# Patient Record
Sex: Male | Born: 1970 | Race: White | Hispanic: Yes | Marital: Single | Smoking: Current every day smoker
Health system: Southern US, Community
[De-identification: ages and names within clinical notes are randomized; demographics above are authoritative.]

## PROBLEM LIST (undated history)

## (undated) HISTORY — PX: OTHER SURGICAL HISTORY: SHX169

---

## 1998-05-03 ENCOUNTER — Emergency Department (HOSPITAL_COMMUNITY): Admission: EM | Admit: 1998-05-03 | Discharge: 1998-05-03 | Payer: Self-pay | Admitting: Emergency Medicine

## 1998-05-05 ENCOUNTER — Emergency Department (HOSPITAL_COMMUNITY): Admission: EM | Admit: 1998-05-05 | Discharge: 1998-05-05 | Payer: Self-pay | Admitting: Emergency Medicine

## 1999-02-19 ENCOUNTER — Emergency Department (HOSPITAL_COMMUNITY): Admission: EM | Admit: 1999-02-19 | Discharge: 1999-02-19 | Payer: Self-pay | Admitting: Emergency Medicine

## 1999-02-20 ENCOUNTER — Encounter: Payer: Self-pay | Admitting: Emergency Medicine

## 2000-01-27 ENCOUNTER — Encounter: Payer: Self-pay | Admitting: Emergency Medicine

## 2000-01-27 ENCOUNTER — Emergency Department (HOSPITAL_COMMUNITY): Admission: EM | Admit: 2000-01-27 | Discharge: 2000-01-27 | Payer: Self-pay | Admitting: Emergency Medicine

## 2002-10-19 ENCOUNTER — Emergency Department (HOSPITAL_COMMUNITY): Admission: AC | Admit: 2002-10-19 | Discharge: 2002-10-19 | Payer: Self-pay

## 2002-10-19 ENCOUNTER — Encounter: Payer: Self-pay | Admitting: Emergency Medicine

## 2003-09-12 ENCOUNTER — Emergency Department (HOSPITAL_COMMUNITY): Admission: AC | Admit: 2003-09-12 | Discharge: 2003-09-12 | Payer: Self-pay

## 2003-11-16 ENCOUNTER — Emergency Department (HOSPITAL_COMMUNITY): Admission: AC | Admit: 2003-11-16 | Discharge: 2003-11-16 | Payer: Self-pay

## 2005-07-20 ENCOUNTER — Inpatient Hospital Stay (HOSPITAL_COMMUNITY): Admission: EM | Admit: 2005-07-20 | Discharge: 2005-07-23 | Payer: Self-pay | Admitting: Emergency Medicine

## 2005-08-01 ENCOUNTER — Emergency Department (HOSPITAL_COMMUNITY): Admission: EM | Admit: 2005-08-01 | Discharge: 2005-08-01 | Payer: Self-pay | Admitting: Emergency Medicine

## 2005-08-04 ENCOUNTER — Encounter: Admission: RE | Admit: 2005-08-04 | Discharge: 2005-10-15 | Payer: Self-pay | Admitting: Orthopaedic Surgery

## 2005-11-09 ENCOUNTER — Ambulatory Visit (HOSPITAL_COMMUNITY): Admission: RE | Admit: 2005-11-09 | Discharge: 2005-11-10 | Payer: Self-pay | Admitting: Orthopaedic Surgery

## 2005-12-16 ENCOUNTER — Encounter: Payer: Self-pay | Admitting: Emergency Medicine

## 2007-02-04 ENCOUNTER — Emergency Department (HOSPITAL_COMMUNITY): Admission: EM | Admit: 2007-02-04 | Discharge: 2007-02-04 | Payer: Self-pay | Admitting: Emergency Medicine

## 2007-09-07 ENCOUNTER — Emergency Department (HOSPITAL_COMMUNITY): Admission: EM | Admit: 2007-09-07 | Discharge: 2007-09-07 | Payer: Self-pay | Admitting: Emergency Medicine

## 2007-09-16 ENCOUNTER — Emergency Department (HOSPITAL_COMMUNITY): Admission: EM | Admit: 2007-09-16 | Discharge: 2007-09-16 | Payer: Self-pay | Admitting: Emergency Medicine

## 2007-09-20 ENCOUNTER — Ambulatory Visit: Payer: Self-pay | Admitting: *Deleted

## 2007-10-11 ENCOUNTER — Emergency Department (HOSPITAL_COMMUNITY): Admission: EM | Admit: 2007-10-11 | Discharge: 2007-10-11 | Payer: Self-pay | Admitting: Emergency Medicine

## 2007-10-27 ENCOUNTER — Emergency Department (HOSPITAL_COMMUNITY): Admission: EM | Admit: 2007-10-27 | Discharge: 2007-10-28 | Payer: Self-pay | Admitting: Emergency Medicine

## 2007-11-01 ENCOUNTER — Emergency Department (HOSPITAL_COMMUNITY): Admission: EM | Admit: 2007-11-01 | Discharge: 2007-11-01 | Payer: Self-pay | Admitting: Emergency Medicine

## 2007-11-14 ENCOUNTER — Ambulatory Visit: Payer: Self-pay | Admitting: Internal Medicine

## 2007-11-14 LAB — CONVERTED CEMR LAB
Albumin: 4.1 g/dL (ref 3.5–5.2)
Alkaline Phosphatase: 69 units/L (ref 39–117)
Amphetamine Screen, Ur: NEGATIVE
BUN: 13 mg/dL (ref 6–23)
Barbiturate Quant, Ur: NEGATIVE
Basophils Relative: 1 % (ref 0–1)
Benzodiazepines.: POSITIVE — AB
CO2: 23 meq/L (ref 19–32)
Creatinine,U: 120.1 mg/dL
Eosinophils Absolute: 0.2 10*3/uL (ref 0.0–0.7)
Glucose, Bld: 111 mg/dL — ABNORMAL HIGH (ref 70–99)
Hemoglobin: 15.5 g/dL (ref 13.0–17.0)
Lymphs Abs: 2 10*3/uL (ref 0.7–4.0)
MCHC: 32.5 g/dL (ref 30.0–36.0)
MCV: 84.1 fL (ref 78.0–100.0)
Methadone: NEGATIVE
Monocytes Absolute: 0.8 10*3/uL (ref 0.1–1.0)
Monocytes Relative: 8 % (ref 3–12)
Potassium: 4.3 meq/L (ref 3.5–5.3)
Propoxyphene: NEGATIVE
RBC: 5.67 M/uL (ref 4.22–5.81)
Sodium: 141 meq/L (ref 135–145)
TSH: 1.18 microintl units/mL (ref 0.350–5.50)
Total Bilirubin: 0.3 mg/dL (ref 0.3–1.2)
Total Protein: 6.5 g/dL (ref 6.0–8.3)
WBC: 10.5 10*3/uL (ref 4.0–10.5)

## 2007-12-05 ENCOUNTER — Emergency Department (HOSPITAL_COMMUNITY): Admission: EM | Admit: 2007-12-05 | Discharge: 2007-12-05 | Payer: Self-pay | Admitting: Emergency Medicine

## 2008-01-08 ENCOUNTER — Emergency Department (HOSPITAL_COMMUNITY): Admission: EM | Admit: 2008-01-08 | Discharge: 2008-01-08 | Payer: Self-pay | Admitting: Emergency Medicine

## 2008-01-18 ENCOUNTER — Emergency Department (HOSPITAL_COMMUNITY): Admission: EM | Admit: 2008-01-18 | Discharge: 2008-01-18 | Payer: Self-pay | Admitting: Emergency Medicine

## 2009-01-30 ENCOUNTER — Emergency Department (HOSPITAL_COMMUNITY): Admission: EM | Admit: 2009-01-30 | Discharge: 2009-01-30 | Payer: Self-pay | Admitting: Emergency Medicine

## 2009-02-01 ENCOUNTER — Ambulatory Visit: Payer: Self-pay | Admitting: Internal Medicine

## 2009-02-04 ENCOUNTER — Emergency Department (HOSPITAL_COMMUNITY): Admission: EM | Admit: 2009-02-04 | Discharge: 2009-02-04 | Payer: Self-pay | Admitting: Emergency Medicine

## 2009-02-06 ENCOUNTER — Emergency Department (HOSPITAL_COMMUNITY): Admission: EM | Admit: 2009-02-06 | Discharge: 2009-02-06 | Payer: Self-pay | Admitting: Emergency Medicine

## 2009-10-11 ENCOUNTER — Emergency Department (HOSPITAL_COMMUNITY): Admission: EM | Admit: 2009-10-11 | Discharge: 2009-10-11 | Payer: Self-pay | Admitting: Emergency Medicine

## 2009-10-12 ENCOUNTER — Emergency Department (HOSPITAL_COMMUNITY): Admission: EM | Admit: 2009-10-12 | Discharge: 2009-10-12 | Payer: Self-pay | Admitting: Emergency Medicine

## 2011-01-02 NOTE — Cardiovascular Report (Signed)
[name], [name]             ACCOUNT NO.:  [id number]   MEDICAL RECORD NO.:  [id number]          PATIENT TYPE:  INP   LOCATION:  5015                         FACILITY:  [facility]   PHYSICIAN:  Mark C. Ophelia Charter, M.D.    DATE OF BIRTH:  [birth year]   DATE OF PROCEDURE:  07/22/2005  DATE OF DISCHARGE:                              CARDIAC CATHETERIZATION   PREOPERATIVE DIAGNOSIS:  Right fourth and fifth displaced metacarpal  fracture.   POSTOPERATIVE DIAGNOSIS:  Right fourth and fifth displaced metacarpal  fracture.   PROCEDURE:  Open reduction internal fixation of right fourth and fifth  metacarpal fracture.   SURGEON:  Mark C. Ophelia Charter, M.D.   ANESTHESIA:  GOT.   TOURNIQUET TIME:  51 minutes.   DESCRIPTION OF PROCEDURE:  After induction of general anesthesia and  orotracheal intubation, the proximal arm tourniquet, preoperative Ancef  prophylaxis, Betadine scrub, Betadine solution, using extremity sheets and  drapes, stockinette, rolled towel, a towel clip and extremity sheets and  drapes were applied.  The skin marker was used on the dorsum of the hand and  a large glove was used to cover the fingers which had lacerations.  The  fingers had some swelling but closure looked good and both the fracture  incisions looked good.  A Coban was used over the fingers to keep the glove  from sliding off and sterile skin marker was used from the dorsum of the  hand mid way between the fourth and fifth metacarpal shafts.  The incision  was made.  The transverse veins were coagulated with the bipolar cautery.  The juncture was slightly divided for exposure.  The fifth metacarpal was  exposed first.  There was a displaced fracture of the mid portion of the  shaft which was a long spiral and it was fixed with two 2.0 mm  interfragmentary screws 8 and 10 mm in length.  A third screw was placed  more proximally to lag the dorsal and volar portions.  This gave excellent  stability.  Stress was  applied.  There was no motion.  There was one portion  of the finger that had a slight buckle,when pressed on it, would partially  reduce but out to length and rotation was anatomic.  Three screws were  placed.  Different spot films were taken to check position and all looked  good.  Next, the fourth metacarpal shaft was exposed.  There were three  fragments and initial lagging two pieces once they were reduced anatomic and  then a third screw was lagged more distally with anatomic fixation and good  rotation.  After copious irrigation, some 4-0 Vicryl reapproximated  subcutaneous tissue, 4-0 nylon skin suture, closure Xeroform and then  checking rotation and rotation of the fingertips looked good.  After repeat  irrigation, the patient was placed in a hand dressing, Xeroform and a  Xeroform applied to the fingers.  A 4 x 4's after Betadine solution applied  to the forearms and copious Webril and Ace wraps.  Instrument count and  needle counts were correct.      Mark C. Ophelia Charter, M.D.  Electronically Signed    MCY/MEDQ  D:  07/22/2005  T:  07/23/2005  Job:  [id number]
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
## 2011-01-02 NOTE — Op Note (Signed)
[name], DOLPH             ACCOUNT NO.:  [id number]   MEDICAL RECORD NO.:  [id number]          PATIENT TYPE:  OIB   LOCATION:  5041                         FACILITY:  [facility]   PHYSICIAN:  Mark C. Ophelia Charter, M.D.    DATE OF BIRTH:  [birth year]   DATE OF PROCEDURE:  11/09/2005  DATE OF DISCHARGE:                                 OPERATIVE REPORT   PREOPERATIVE DIAGNOSES:  1.  Status post right both bone forearm fracture,  2.  Metacarpal fractures; and,  3.  Finger lacerations with both bone forearm fracture, malunion with bent      plates.   POSTOPERATIVE DIAGNOSES:  1.  Status post right both bone forearm fracture,  2.  Metacarpal fractures; and,  3.  Finger lacerations with both bone forearm fracture, malunion with bent      plates.   OPERATION PERFORMED:  1.  Takedown of malunion, right both bone forearm fracture,  2.  Replating; and,  3.  Graft-on grafting of radius and ulna.   SURGEON:  Mark C. Ophelia Charter, M.D.   ASSISTANT:  Vanita Panda. Magnus Ivan, M.D.   TOURNIQUET TIME:  The tourniquet time was two hours.   DESCRIPTION OF THE OPERATION:  After the induction of general anesthesia,  orotracheal intubation, preoperative Ancef prophylaxis, placement of  proximal arm tourniquet, and prepping of the entire right upper extremity  with DuraPrep up to the tourniquet were carried out.  Stockinette, extremity  sheaths, drapes, towels, and towel clips were applied.  The stockinette was  cut, a glove was placed over the hand and a Betadine Vi-drape was applied  sealing the skin.   After a skin marker was used the patient had a volar incision for the radius  exposure, which had widened with a keloid and this scar was excised.  Careful blunt dissection with loop magnification was performed going through  the old scar.  The radial artery was dissected free and some  branches more  proximally into the volar muscles had to be divided in order to allow  exposure since the patient had  extensive scar tissue.  The superficial  radial nerve was identified underneath the brachioradialis and protected.   The plate was identified and followed from distal to proximal.  The patient  had a muscular forearm in the proximal aspect, which was about 4 cm below  the radial tuberosity.  The proximal portion of the plate was finally  identified. This was a recon plate fixed with locking Synthes screw and the  plate had been at the level of the fracture site.  Screws were removed and  then plate was removed.  The ulna was then exposed and similarly the  hardware was removed.   The malunion of the ulna still had some motion.  The fracture site was  cleaned out using a hand rongeur and a tiny curet.  The ulna was placed back  in a straight position and an eight-hole locking 3.5 DCP plate was applied  leaving the hole __________  fracture empty and filling all the rest with  locking screws getting eight cortices on one side  of the fracture and six on  the other.  There was a small butterfly piece on the opposite cortex from  the plate and fibrous tissue was curetted.   Next, attention was turned back to the radius.  The radius had some  hypertrophic callous excised. The fracture line was cleaned out again with  small curet and then a six-hole 3.5 DCP locking plate was applied.  All  holes were locked down with the locking screw, except for the one directly  at the fracture site, which was a regular 3.5 bicortical angled stylet for  purchase.  Some graft-on was mixed with putty plus bone chips to graft the  radius.  A small amount was placed in the ulna.  There was not really much  room at the ulna for any bone graft.  The radial artery was preserved.   The tourniquet was deflated after two hours, which was near the end of the  case where the plate was being adjusted for the radius.  Fluoroscopy was  used to check the distal radioulnar joint of the radial with __________  flexion and  extended to make sure that the radial head was well reduced.  The patient had good pronation, but lacked about 30 degrees of full  supination.  X-ray showed satisfactory position of the radius and ulna.   After irrigation with saline solution the subcutaneous tissue was  reapproximated with 2-0 Vicryl and then skin closure was done.  Postoperative dressing and sugar tong splint were placed.   COUNTS:  Instrument count and needle count were correct.      Mark C. Ophelia Charter, M.D.  Electronically Signed     MCY/MEDQ  D:  11/09/2005  T:  11/11/2005  Job:  [id number]
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
## 2013-09-13 ENCOUNTER — Emergency Department (HOSPITAL_COMMUNITY)
Admission: EM | Admit: 2013-09-13 | Discharge: 2013-09-13 | Disposition: A | Discharge status: Home / Self Care | Payer: Self-pay | Attending: Emergency Medicine | Admitting: Emergency Medicine

## 2013-09-13 ENCOUNTER — Encounter (HOSPITAL_COMMUNITY): Payer: Self-pay | Admitting: Emergency Medicine

## 2013-09-13 DIAGNOSIS — B353 Tinea pedis: Secondary | ICD-10-CM | POA: Insufficient documentation

## 2013-09-13 DIAGNOSIS — F17200 Nicotine dependence, unspecified, uncomplicated: Secondary | ICD-10-CM | POA: Insufficient documentation

## 2013-09-13 MED ORDER — CLOTRIMAZOLE 1 % EX SOLN: Freq: Two times a day (BID) | CUTANEOUS | Status: DC

## 2013-09-13 MED ORDER — CLOTRIMAZOLE 1 % EX CREA
TOPICAL_CREAM | Freq: Once | CUTANEOUS | Status: DC
  Filled 2013-09-13: qty 15

## 2013-09-13 NOTE — ED Provider Notes (Signed)
Medical screening examination/treatment/procedure(s) were performed by non-physician practitioner and as supervising physician I was immediately available for consultation/collaboration.    Dione Boozeavid Jamille Yoshino, MD 09/13/13 [phone]

## 2013-09-13 NOTE — ED Provider Notes (Signed)
CSN: [id number]     Arrival date & time 09/13/13  0256 History   First MD Initiated Contact with Patient 09/13/13 0319     Chief Complaint  Patient presents with  . Painful feet    (Consider location/radiation/quality/duration/timing/severity/associated sxs/prior Treatment) HPI Comments: Patient states he used to work in a wet environment where his feet were always wet.  He contracted Tinea pedis for which he has been treated with a topical and PO antifungal  It initially got better but has flared up in the past weeks.  He no longer works at that job.   The history is provided by the patient.    History reviewed. No pertinent past medical history. Past Surgical History  Procedure Laterality Date  . Arm surgery     History reviewed. No pertinent family history. History  Substance Use Topics  . Smoking status: Current Every Day Smoker  . Smokeless tobacco: Never Used  . Alcohol Use: No    Review of Systems  Constitutional: Negative for fever.  Musculoskeletal: Negative for joint swelling.  Skin: Positive for rash.  Neurological: Negative for numbness.  All other systems reviewed and are negative.    Allergies  Review of patient's allergies indicates no known allergies.  Home Medications   Current Outpatient Rx  Name  Route  Sig  Dispense  Refill  . clotrimazole (LOTRIMIN) 1 % external solution   Topical   Apply topically 2 (two) times daily.   30 mL   0    BP 144/98  Pulse 92  Temp(Src) 97.6 F (36.4 C) (Oral)  Resp 20  Ht 5\' 8"  (1.727 m)  Wt 180 lb (81.647 kg)  BMI 27.38 kg/m2  SpO2 99% Physical Exam  Nursing note and vitals reviewed. Constitutional: He appears well-developed and well-nourished.  HENT:  Head: Normocephalic.  Eyes: Pupils are equal, round, and reactive to light.  Neck: Normal range of motion.  Cardiovascular: Normal rate and regular rhythm.   Pulmonary/Chest: Effort normal and breath sounds normal.  Musculoskeletal: He exhibits  tenderness. He exhibits no edema.  Tinea between toes on Rgiht foot between toes 3,4 On Left foot between 2,3  3,4 without swelling   Neurological: He is alert.  Skin: Skin is warm. No erythema.    ED Course  Procedures (including critical care time) Labs Review Labs Reviewed - No data to display Imaging Review No results found.  EKG Interpretation   None       MDM   1. Tinea pedis of both feet     Patient has been give Lotrim ointment int he ED and Rx for same and referral to Podiatry     Arman FilterGail K Bennet Kujawa, NP 09/13/13 [phone]  Arman FilterGail K Josslynn Mentzer, NP 09/13/13 [phone]

## 2013-09-13 NOTE — ED Notes (Signed)
Patient presents stating he has a "fungus" on his feet.  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

## 2013-09-13 NOTE — Discharge Instructions (Signed)
Make sure your feet are clean and dry prior to applying the medication In the house wear white socks and your sandals  When out please make sure your shoes are dry

## 2013-09-13 NOTE — ED Notes (Signed)
Discussed medication ordered and was going to apply, but patient prefers to go upstairs to be with sister, and will apply it then himself.  He states he already "uses this at home".

## 2014-02-26 ENCOUNTER — Encounter (HOSPITAL_COMMUNITY): Payer: Self-pay | Admitting: Emergency Medicine

## 2014-02-26 ENCOUNTER — Emergency Department (HOSPITAL_COMMUNITY)
Admission: EM | Admit: 2014-02-26 | Discharge: 2014-02-26 | Disposition: A | Discharge status: Home / Self Care | Payer: Self-pay | Attending: Emergency Medicine | Admitting: Emergency Medicine

## 2014-02-26 DIAGNOSIS — F17200 Nicotine dependence, unspecified, uncomplicated: Secondary | ICD-10-CM | POA: Insufficient documentation

## 2014-02-26 DIAGNOSIS — H00019 Hordeolum externum unspecified eye, unspecified eyelid: Secondary | ICD-10-CM | POA: Insufficient documentation

## 2014-02-26 DIAGNOSIS — H00026 Hordeolum internum left eye, unspecified eyelid: Secondary | ICD-10-CM

## 2014-02-26 MED ORDER — ERYTHROMYCIN 5 MG/GM OP OINT: TOPICAL_OINTMENT | OPHTHALMIC | Status: DC

## 2014-02-26 NOTE — ED Notes (Signed)
Pt. Paged, no response.

## 2014-02-26 NOTE — ED Notes (Signed)
Bilateral eye redness and drainage x 2 days. 

## 2014-02-26 NOTE — ED Provider Notes (Signed)
CSN: [id number]     Arrival date & time 02/26/14  1312 History  This chart was scribed for non-physician practitioner working with Rolland PorterMark James, MD by Leone PayorSonum Patel, ED Scribe. This patient was seen in room TR04C/TR04C and the patient's care was started at 4:00 PM.     Chief Complaint  Patient presents with  . Eye Pain   The history is provided by the patient. No language interpreter was used.    HPI Comments: [name] is a 43 y.o. male who presents to the Emergency Department complaining of constant right eye redness and pain that began 1 week ago. Patient reports constant, gradually worsened left eye pain, swelling, and redness that began yesterday. He reports associated photophobia and states it is alleviated by resting in the dark. He denies any other symptoms.   History reviewed. No pertinent past medical history. Past Surgical History  Procedure Laterality Date  . Arm surgery     No family history on file. History  Substance Use Topics  . Smoking status: Current Every Day Smoker  . Smokeless tobacco: Never Used  . Alcohol Use: No    Review of Systems  Constitutional: Negative for fever, chills and fatigue.  HENT: Negative for trouble swallowing.   Eyes: Positive for photophobia, pain and redness. Negative for visual disturbance.  Respiratory: Negative for shortness of breath.   Cardiovascular: Negative for chest pain and palpitations.  Gastrointestinal: Negative for nausea, vomiting, abdominal pain and diarrhea.  Genitourinary: Negative for dysuria and difficulty urinating.  Musculoskeletal: Negative for arthralgias and neck pain.  Skin: Negative for color change.  Neurological: Negative for dizziness and weakness.  Psychiatric/Behavioral: Negative for dysphoric mood.      Allergies  Review of patient's allergies indicates no known allergies.  Home Medications   Prior to Admission medications   Not on File   BP 137/91  Pulse 56  Temp(Src) 98.7 F (37.1  C)  Resp 16  SpO2 100% Physical Exam  Nursing note and vitals reviewed. Constitutional: He is oriented to person, place, and time. He appears well-developed and well-nourished.  HENT:  Head: Normocephalic and atraumatic.  Eyes: Conjunctivae and EOM are normal. Pupils are equal, round, and reactive to light. Left eye exhibits hordeolum. Right conjunctiva is not injected. Left conjunctiva is not injected.  Tenderness to palpation of left lower eyelid with localized swelling. No drainage noted. No conjunctival injection.   Neck: Normal range of motion.  Cardiovascular: Normal rate.   Pulmonary/Chest: Effort normal.  Abdominal: He exhibits no distension.  Musculoskeletal: Normal range of motion.  Neurological: He is alert and oriented to person, place, and time.  Skin: Skin is warm and dry.  Psychiatric: He has a normal mood and affect. His behavior is normal.    ED Course  Procedures (including critical care time)  DIAGNOSTIC STUDIES: Oxygen Saturation is 100% on RA, normal by my interpretation.    COORDINATION OF CARE: 4:04 PM Discussed treatment plan with pt at bedside and pt agreed to plan.   Labs Review Labs Reviewed - No data to display  Imaging Review No results found.   EKG Interpretation None      MDM   Final diagnoses:  Hordeolum eyelid, internal, left    4:09 PM Patient has a hordeolum of the left lower eyelid. Patient will have erythromycin ointment for symptoms. No vision impairment or injury. Vitals stable and patient afebrile.   I personally performed the services described in this documentation, which was scribed in my  presence. The recorded information has been reviewed and is accurate.   Emilia Beck, PA-C 02/26/14 1611

## 2014-02-26 NOTE — Discharge Instructions (Signed)
Use erythromycin ointment as directed until symptoms resolve. Refer to attached documents for more information. Return to the ED with worsening or concerning symptoms.

## 2014-03-06 NOTE — ED Provider Notes (Signed)
Medical screening examination/treatment/procedure(s) were performed by non-physician practitioner and as supervising physician I was immediately available for consultation/collaboration.   EKG Interpretation None        Rolland PorterMark Kalyna Paolella, MD 03/06/14 [phone]

## 2015-05-06 ENCOUNTER — Emergency Department (HOSPITAL_COMMUNITY)
Admission: EM | Admit: 2015-05-06 | Discharge: 2015-05-06 | Disposition: A | Discharge status: Home / Self Care | Payer: Self-pay | Attending: Emergency Medicine | Admitting: Emergency Medicine

## 2015-05-06 ENCOUNTER — Encounter (HOSPITAL_COMMUNITY): Payer: Self-pay

## 2015-05-06 DIAGNOSIS — R112 Nausea with vomiting, unspecified: Secondary | ICD-10-CM | POA: Insufficient documentation

## 2015-05-06 DIAGNOSIS — Z720 Tobacco use: Secondary | ICD-10-CM | POA: Insufficient documentation

## 2015-05-06 DIAGNOSIS — R197 Diarrhea, unspecified: Secondary | ICD-10-CM | POA: Insufficient documentation

## 2015-05-06 LAB — COMPREHENSIVE METABOLIC PANEL
ALT: 24 U/L (ref 17–63)
AST: 20 U/L (ref 15–41)
Albumin: 3.5 g/dL (ref 3.5–5.0)
Alkaline Phosphatase: 55 U/L (ref 38–126)
Anion gap: 8 (ref 5–15)
BUN: 7 mg/dL (ref 6–20)
CHLORIDE: 107 mmol/L (ref 101–111)
CO2: 26 mmol/L (ref 22–32)
CREATININE: 0.95 mg/dL (ref 0.61–1.24)
Calcium: 9 mg/dL (ref 8.9–10.3)
Glucose, Bld: 98 mg/dL (ref 65–99)
POTASSIUM: 4.4 mmol/L (ref 3.5–5.1)
Sodium: 141 mmol/L (ref 135–145)
Total Bilirubin: 0.4 mg/dL (ref 0.3–1.2)
Total Protein: 5.8 g/dL — ABNORMAL LOW (ref 6.5–8.1)

## 2015-05-06 LAB — URINALYSIS, ROUTINE W REFLEX MICROSCOPIC
BILIRUBIN URINE: NEGATIVE
GLUCOSE, UA: NEGATIVE mg/dL
HGB URINE DIPSTICK: NEGATIVE
Ketones, ur: NEGATIVE mg/dL
Leukocytes, UA: NEGATIVE
Nitrite: NEGATIVE
Protein, ur: NEGATIVE mg/dL
SPECIFIC GRAVITY, URINE: 1.022 (ref 1.005–1.030)
UROBILINOGEN UA: 0.2 mg/dL (ref 0.0–1.0)
pH: 7.5 (ref 5.0–8.0)

## 2015-05-06 LAB — LIPASE, BLOOD: LIPASE: 23 U/L (ref 22–51)

## 2015-05-06 LAB — CBC
HEMATOCRIT: 44.6 % (ref 39.0–52.0)
HEMOGLOBIN: 14.8 g/dL (ref 13.0–17.0)
MCH: 27.3 pg (ref 26.0–34.0)
MCHC: 33.2 g/dL (ref 30.0–36.0)
MCV: 82.3 fL (ref 78.0–100.0)
Platelets: 160 10*3/uL (ref 150–400)
RBC: 5.42 MIL/uL (ref 4.22–5.81)
RDW: 13.9 % (ref 11.5–15.5)
WBC: 11.4 10*3/uL — AB (ref 4.0–10.5)

## 2015-05-06 LAB — POC OCCULT BLOOD, ED: FECAL OCCULT BLD: POSITIVE — AB

## 2015-05-06 MED ORDER — ONDANSETRON 4 MG PO TBDP
4.0000 mg | ORAL_TABLET | Freq: Once | ORAL | Status: AC | PRN
  Administered 2015-05-06: 4 mg via ORAL
  Filled 2015-05-06: qty 1

## 2015-05-06 MED ORDER — ACETAMINOPHEN 500 MG PO TABS: 500.0000 mg | ORAL_TABLET | Freq: Four times a day (QID) | ORAL | Status: AC | PRN

## 2015-05-06 MED ORDER — ONDANSETRON HCL 4 MG PO TABS: 4.0000 mg | ORAL_TABLET | Freq: Four times a day (QID) | ORAL | Status: AC

## 2015-05-06 MED ORDER — ACETAMINOPHEN 500 MG PO TABS
500.0000 mg | ORAL_TABLET | Freq: Once | ORAL | Status: AC
  Administered 2015-05-06: 500 mg via ORAL
  Filled 2015-05-06: qty 1

## 2015-05-06 MED ORDER — SODIUM CHLORIDE 0.9 % IV BOLUS (SEPSIS)
1000.0000 mL | Freq: Once | INTRAVENOUS | Status: AC
  Administered 2015-05-06: 1000 mL via INTRAVENOUS

## 2015-05-06 NOTE — ED Notes (Signed)
Tolerating fluids.

## 2015-05-06 NOTE — ED Provider Notes (Signed)
CSN: [id number]     Arrival date & time 05/06/15  0848 History   First MD Initiated Contact with Patient 05/06/15 1008     Chief Complaint  Patient presents with  . Emesis  . Diarrhea     (Consider location/radiation/quality/duration/timing/severity/associated sxs/prior Treatment) HPI   [name] is a 44 y.o. male with no significant PMH who presents with abdominal pain, nausea, vomiting, and diarrhea since 7 PM last night. He states he ate a salad and pasta for lunch, and didn't feel quite right afterwards.  He states his vomit has been bright red, and had over 8 episodes.  He states his diarrhea has been bright red and watery.  He reports undocumented fevers.  Associated sxs include headache, chills, SOB, and neck pain.  Denies neck stiffness, CP, or urinary symptoms.   History reviewed. No pertinent past medical history. Past Surgical History  Procedure Laterality Date  . Arm surgery     History reviewed. No pertinent family history. Social History  Substance Use Topics  . Smoking status: Current Every Day Smoker  . Smokeless tobacco: Never Used  . Alcohol Use: No    Review of Systems All other systems negative unless otherwise stated in HPI    Allergies  Review of patient's allergies indicates no known allergies.  Home Medications   Prior to Admission medications   Medication Sig Start Date End Date Taking? Authorizing Provider  bismuth subsalicylate (PEPTO BISMOL) 262 MG/15ML suspension Take 30 mLs by mouth every 6 (six) hours as needed for diarrhea or loose stools.   Yes Historical Provider, MD   BP 140/87 mmHg  Pulse 65  Temp(Src) 99 F (37.2 C) (Oral)  Resp 18  Wt 180 lb (81.647 kg)  SpO2 100% Physical Exam  Constitutional: He is oriented to person, place, and time. He appears well-developed and well-nourished.  HENT:  Head: Normocephalic and atraumatic.  Mouth/Throat: Oropharynx is clear and moist.  Eyes: Pupils are equal, round, and reactive to  light.  Neck: Normal range of motion. Neck supple.  No nuchal rigidity.  Cardiovascular: Normal rate, regular rhythm and normal heart sounds.   No murmur heard. Pulmonary/Chest: Effort normal and breath sounds normal. No respiratory distress. He has no wheezes. He has no rales.  Abdominal: Soft. Bowel sounds are normal. He exhibits no distension. There is tenderness in the suprapubic area. There is no rigidity, no rebound and no guarding.  Musculoskeletal: Normal range of motion.  Lymphadenopathy:    He has no cervical adenopathy.  Neurological: He is alert and oriented to person, place, and time.  Skin: Skin is warm and dry.  Psychiatric: He has a normal mood and affect. His behavior is normal.    ED Course  Procedures (including critical care time) Labs Review Labs Reviewed  COMPREHENSIVE METABOLIC PANEL - Abnormal; Notable for the following:    Total Protein 5.8 (*)    All other components within normal limits  CBC - Abnormal; Notable for the following:    WBC 11.4 (*)    All other components within normal limits  LIPASE, BLOOD  URINALYSIS, ROUTINE W REFLEX MICROSCOPIC (NOT AT [facility])  POC OCCULT BLOOD, ED    Imaging Review No results found. I have personally reviewed and evaluated these images and lab results as part of my medical decision-making.   EKG Interpretation None      MDM   Final diagnoses:  None    Patient presents with N/V/D and abdominal pain since last night.  VSS, patient appears nontoxic, NAD.  On exam, mild suprapubic abdominal pain.  No nuchal rigidity.    Suspect N/V/D with abdominal pain.  Low suspicion for acute abdominal etiology such as appendicitis.   Imaging not performed.  Labs include UA, lipase, CBC, CMP, and stool occult blood.  Will PO challenge.  Will give IVF.  Fecal occult--positive.  Per nurse, sample was of normal stool color.  Pt given tylenol and zofran in ED.  Pt stable for d/c.  Able to tolerate PO intake.  Advised to  follow up this week with [facility] for repeat hemoglobin.  Discussed return precautions and supportive care.  Patient acknowledges and agrees with the above plan.  Blood pressure 131/80, pulse 62, temperature 99 F (37.2 C), temperature source Oral, resp. rate 18, weight 180 lb (81.647 kg), SpO2 98 %.   Case has been discussed with and seen by Dr. Effie Shy who agrees with the above plan for discharge.     Cheri Fowler, PA-C 05/06/15 1253  Mancel Bale, MD 05/06/15 2052

## 2015-05-06 NOTE — ED Notes (Signed)
C/o N/V/D s/p eating salad yesterday 7pm

## 2015-05-06 NOTE — ED Notes (Signed)
Pt alert x4 respirations easy non labored.  

## 2015-05-06 NOTE — ED Provider Notes (Signed)
  Face-to-face evaluation   History: He presents for evaluation of vomiting, diarrhea, onset after eating. Suspected food toxin, salad. He complains of mild abdominal pain. The emesis and stool are "red".    Physical exam: Alert, calm, cooperative. Abdomen moderate mid epigastric tenderness. No rebound tenderness.  Medical screening examination/treatment/procedure(s) were conducted as a shared visit with non-physician practitioner(s) and myself.  I personally evaluated the patient during the encounter  Mancel Bale, MD 05/06/15 2052

## 2015-05-06 NOTE — Discharge Instructions (Signed)
Follow up this week with [facility] for repeat hemoglobin.  Nausea and Vomiting Nausea is a sick feeling that often comes before throwing up (vomiting). Vomiting is a reflex where stomach contents come out of your mouth. Vomiting can cause severe loss of body fluids (dehydration). Children and elderly adults can become dehydrated quickly, especially if they also have diarrhea. Nausea and vomiting are symptoms of a condition or disease. It is important to find the cause of your symptoms. CAUSES   Direct irritation of the stomach lining. This irritation can result from increased acid production (gastroesophageal reflux disease), infection, food poisoning, taking certain medicines (such as nonsteroidal anti-inflammatory drugs), alcohol use, or tobacco use.  Signals from the brain.These signals could be caused by a headache, heat exposure, an inner ear disturbance, increased pressure in the brain from injury, infection, a tumor, or a concussion, pain, emotional stimulus, or metabolic problems.  An obstruction in the gastrointestinal tract (bowel obstruction).  Illnesses such as diabetes, hepatitis, gallbladder problems, appendicitis, kidney problems, cancer, sepsis, atypical symptoms of a heart attack, or eating disorders.  Medical treatments such as chemotherapy and radiation.  Receiving medicine that makes you sleep (general anesthetic) during surgery. DIAGNOSIS Your caregiver may ask for tests to be done if the problems do not improve after a few days. Tests may also be done if symptoms are severe or if the reason for the nausea and vomiting is not clear. Tests may include:  Urine tests.  Blood tests.  Stool tests.  Cultures (to look for evidence of infection).  X-rays or other imaging studies. Test results can help your caregiver make decisions about treatment or the need for additional tests. TREATMENT You need to stay well hydrated. Drink frequently but in small  amounts.You may wish to drink water, sports drinks, clear broth, or eat frozen ice pops or gelatin dessert to help stay hydrated.When you eat, eating slowly may help prevent nausea.There are also some antinausea medicines that may help prevent nausea. HOME CARE INSTRUCTIONS   Take all medicine as directed by your caregiver.  If you do not have an appetite, do not force yourself to eat. However, you must continue to drink fluids.  If you have an appetite, eat a normal diet unless your caregiver tells you differently.  Eat a variety of complex carbohydrates (rice, wheat, potatoes, bread), lean meats, yogurt, fruits, and vegetables.  Avoid high-fat foods because they are more difficult to digest.  Drink enough water and fluids to keep your urine clear or pale yellow.  If you are dehydrated, ask your caregiver for specific rehydration instructions. Signs of dehydration may include:  Severe thirst.  Dry lips and mouth.  Dizziness.  Dark urine.  Decreasing urine frequency and amount.  Confusion.  Rapid breathing or pulse. SEEK IMMEDIATE MEDICAL CARE IF:   You have blood or brown flecks (like coffee grounds) in your vomit.  You have black or bloody stools.  You have a severe headache or stiff neck.  You are confused.  You have severe abdominal pain.  You have chest pain or trouble breathing.  You do not urinate at least once every 8 hours.  You develop cold or clammy skin.  You continue to vomit for longer than 24 to 48 hours.  You have a fever. MAKE SURE YOU:   Understand these instructions.  Will watch your condition.  Will get help right away if you are not doing well or get worse. Document Released: 08/03/2005 Document Revised: 10/26/2011 Document  Reviewed: 12/31/2010 Exit[employer] Patient Information 2015 [place], [place]. This information is not intended to replace advice given to you by your health care provider. Make sure you discuss any questions you have  with your health care provider.

## 2016-09-30 ENCOUNTER — Emergency Department (HOSPITAL_COMMUNITY): Payer: No Typology Code available for payment source

## 2016-09-30 ENCOUNTER — Encounter (HOSPITAL_COMMUNITY): Payer: Self-pay

## 2016-09-30 ENCOUNTER — Emergency Department (HOSPITAL_COMMUNITY)
Admission: EM | Admit: 2016-09-30 | Discharge: 2016-09-30 | Disposition: A | Discharge status: Home / Self Care | Payer: No Typology Code available for payment source | Attending: Emergency Medicine | Admitting: Emergency Medicine

## 2016-09-30 DIAGNOSIS — Y939 Activity, unspecified: Secondary | ICD-10-CM | POA: Diagnosis not present

## 2016-09-30 DIAGNOSIS — Y92410 Unspecified street and highway as the place of occurrence of the external cause: Secondary | ICD-10-CM | POA: Diagnosis not present

## 2016-09-30 DIAGNOSIS — Y999 Unspecified external cause status: Secondary | ICD-10-CM | POA: Diagnosis not present

## 2016-09-30 DIAGNOSIS — S43402A Unspecified sprain of left shoulder joint, initial encounter: Secondary | ICD-10-CM | POA: Diagnosis not present

## 2016-09-30 DIAGNOSIS — F17200 Nicotine dependence, unspecified, uncomplicated: Secondary | ICD-10-CM | POA: Diagnosis not present

## 2016-09-30 DIAGNOSIS — S4992XA Unspecified injury of left shoulder and upper arm, initial encounter: Secondary | ICD-10-CM | POA: Diagnosis present

## 2016-09-30 MED ORDER — IBUPROFEN 800 MG PO TABS: 800.0000 mg | ORAL_TABLET | Freq: Three times a day (TID) | ORAL | 0 refills | Status: AC
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
## 2018-08-07 IMAGING — CR DG SHOULDER 2+V*L*
3 series · 3 of 3 positions shown · non-contrast
Comparison: Chest x-ray of 06/22/2010.

CLINICAL DATA: Pain.  No reported injury.

EXAM:
LEFT SHOULDER - 2+ VIEW

[shoulder grashey]
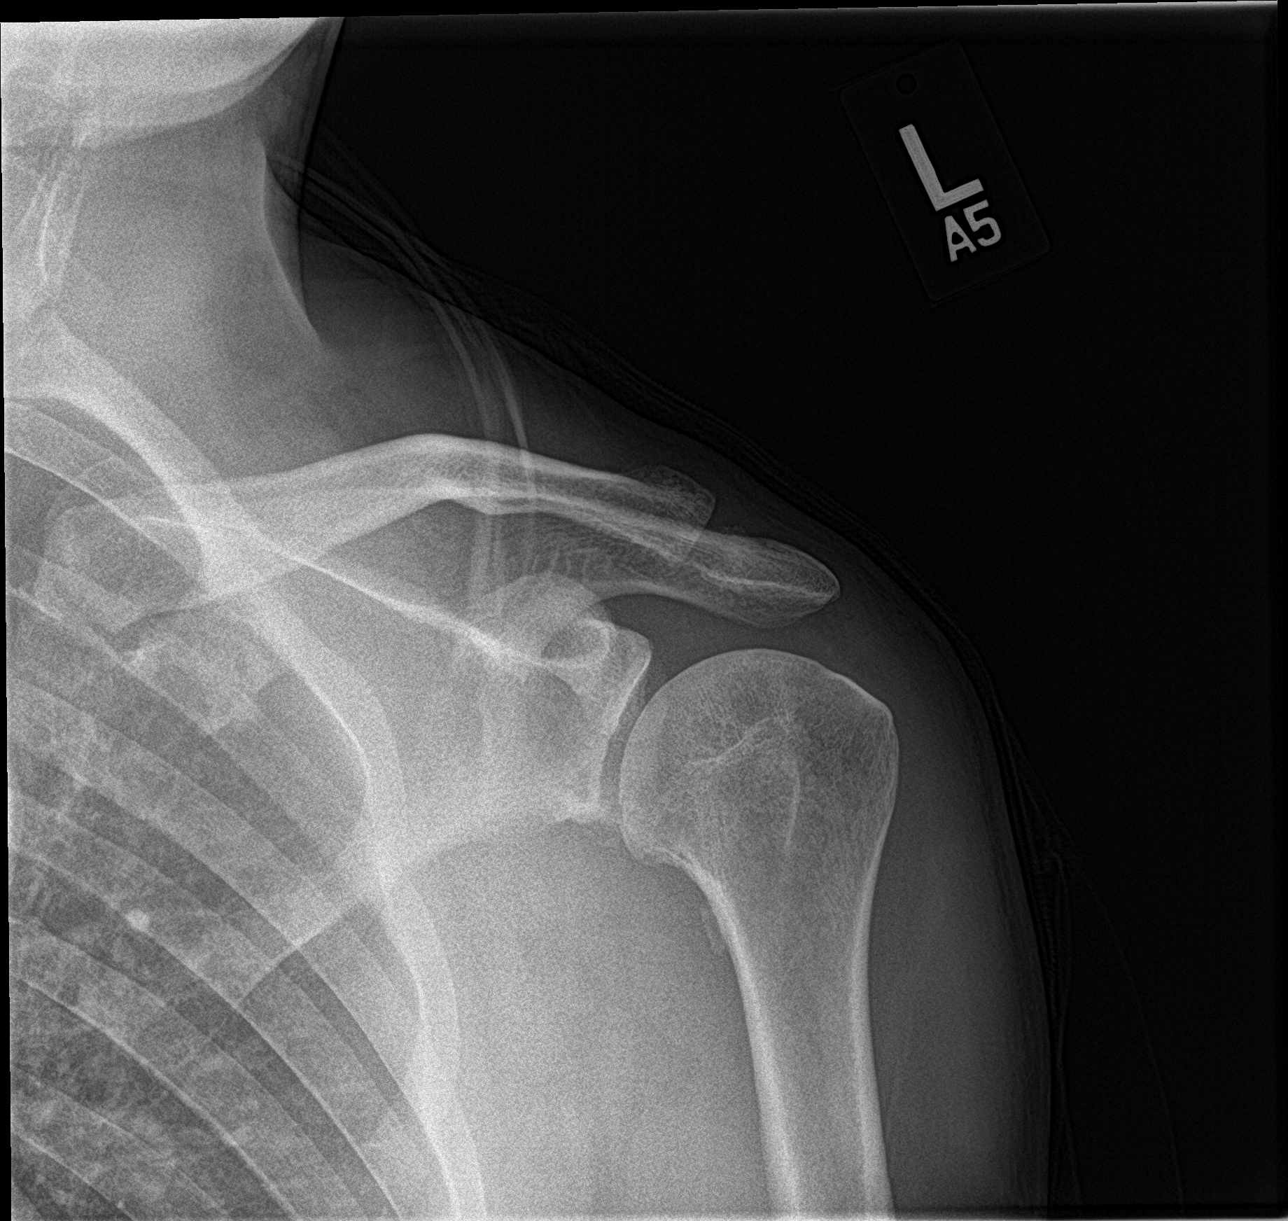

[shoulder axillary]
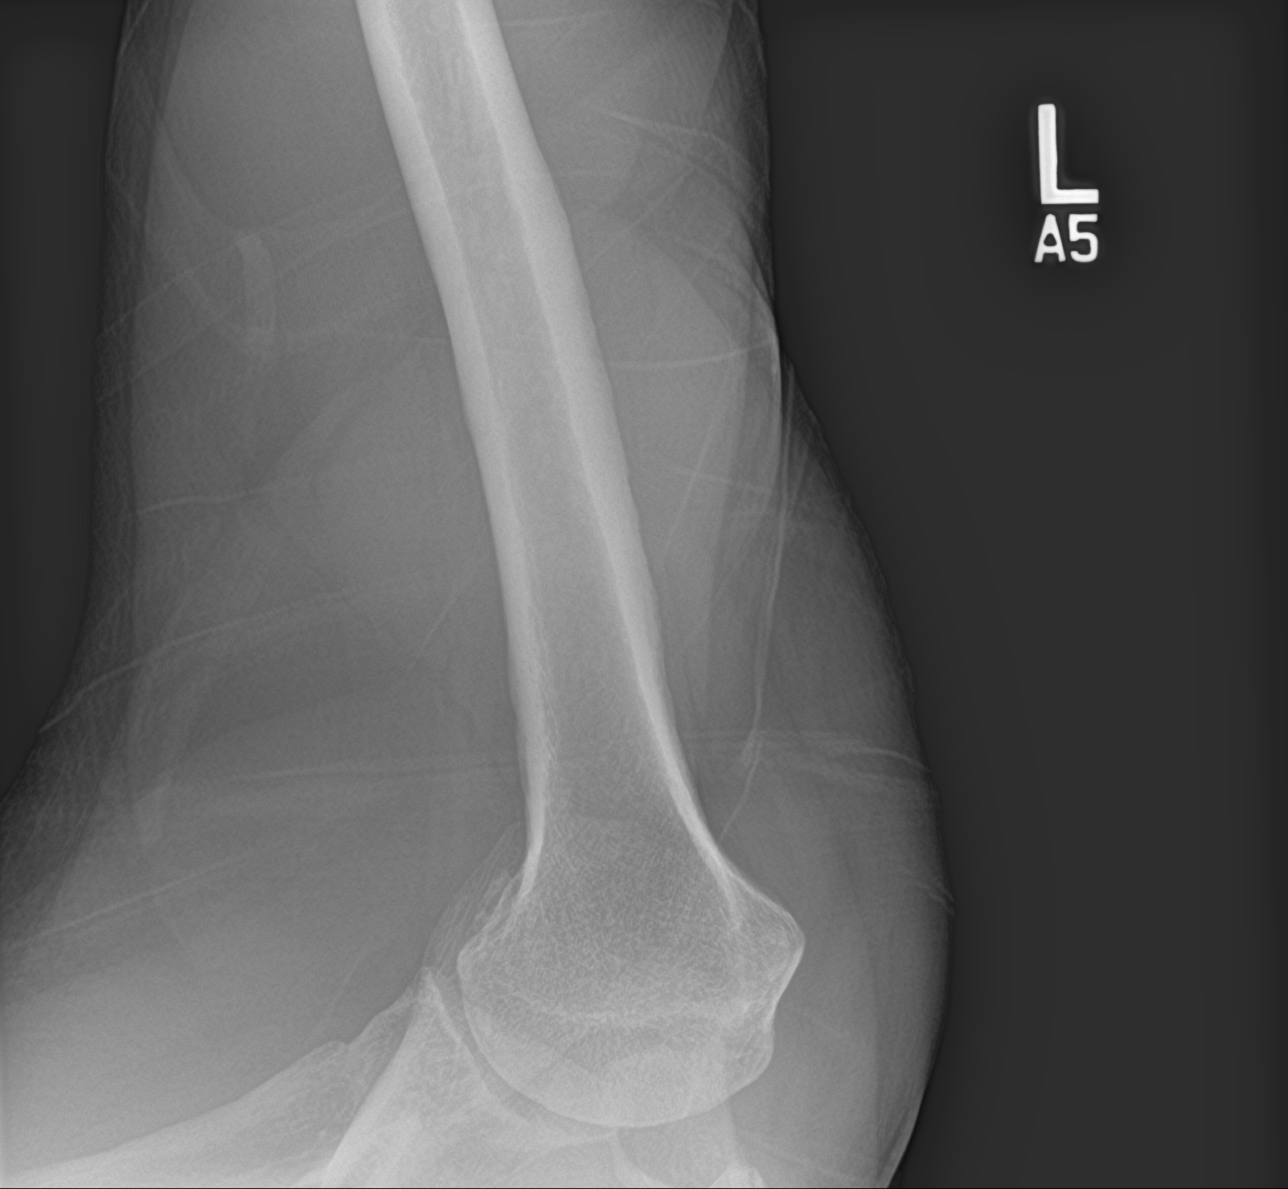

[shoulder y view]
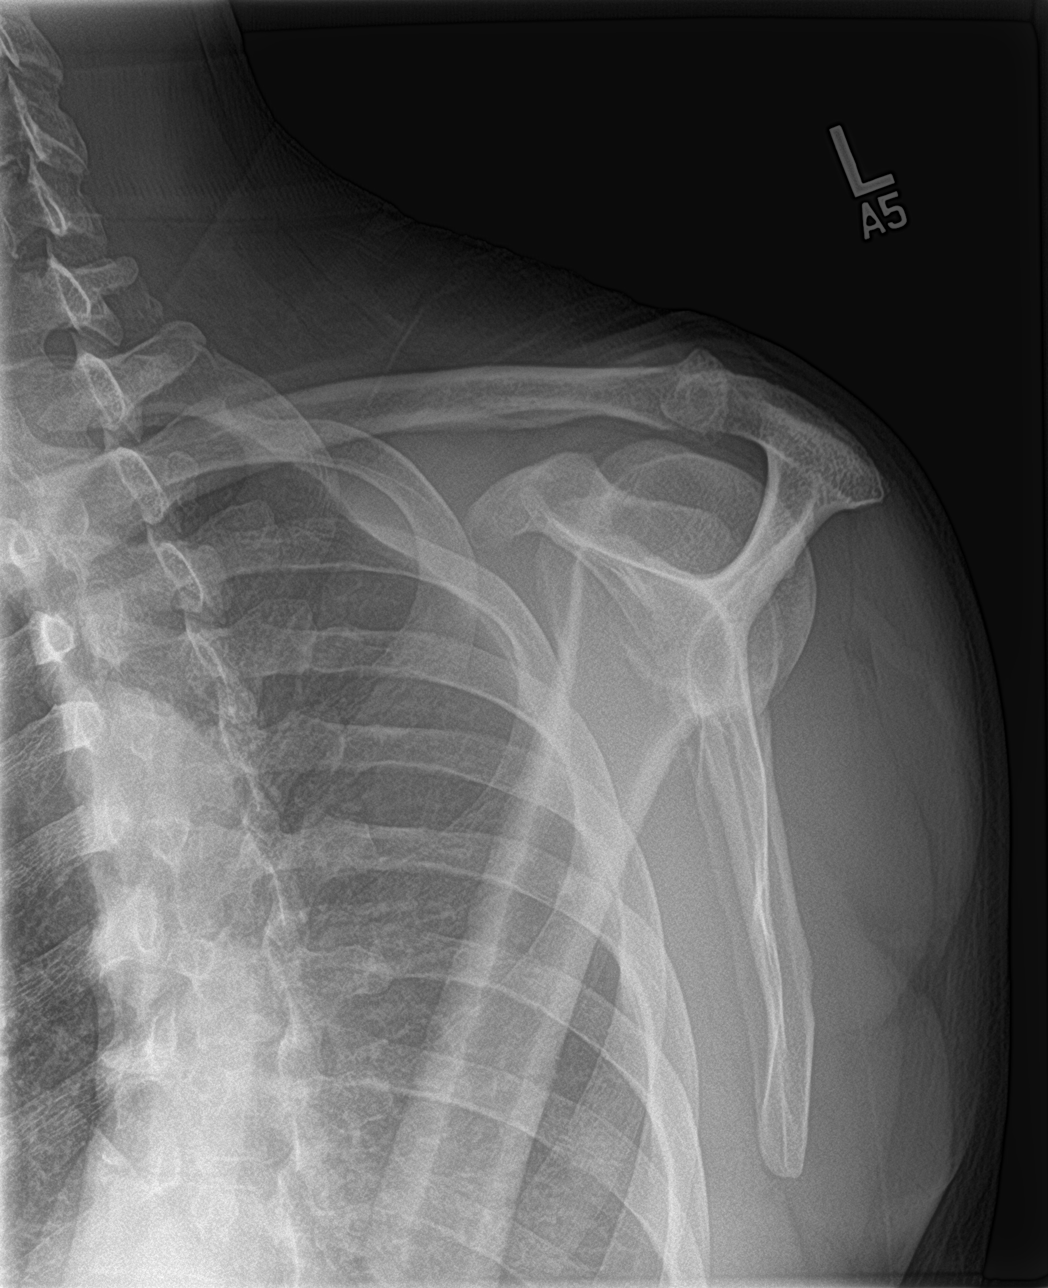

[3 of 3 positions shown; findings below may reference images not displayed]

FINDINGS: Small corticated bony density noted along the medial aspect of the
proximal left humeral metaphysis. This may be from prior injury.
Acromioclavicular glenohumeral degenerative change. Loose body
cannot be excluded. No evidence of dislocation or separation.
IMPRESSION: 1. Tiny corticated bony density noted along the medial aspect of the
proximal left humeral metaphysis. This may be a site of prior
injury.

2. Acromioclavicular degenerative change. Glenohumeral degenerative
change. Small loose body noted.
